# Patient Record
Sex: Female | Born: 2000 | Race: White | Hispanic: No | Marital: Single | Smoking: Current some day smoker
Health system: Southern US, Community
[De-identification: ages and names within clinical notes are randomized; demographics above are authoritative.]

## PROBLEM LIST (undated history)

## (undated) DIAGNOSIS — F419 Anxiety disorder, unspecified: Secondary | ICD-10-CM

## (undated) HISTORY — DX: Anxiety disorder, unspecified: F41.9

## (undated) HISTORY — PX: TONSILLECTOMY: SUR1361

---
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
## 2018-06-27 ENCOUNTER — Ambulatory Visit (INDEPENDENT_AMBULATORY_CARE_PROVIDER_SITE_OTHER): Admitting: Family Medicine

## 2018-06-27 ENCOUNTER — Encounter: Payer: Self-pay | Admitting: Family Medicine

## 2018-06-27 VITALS — BP 110/70 | HR 74 | Temp 98.6°F | Resp 15 | Wt 148.1 lb

## 2018-06-27 DIAGNOSIS — N3001 Acute cystitis with hematuria: Secondary | ICD-10-CM

## 2018-06-27 LAB — URINALYSIS, ROUTINE W REFLEX MICROSCOPIC
Bilirubin Urine: NEGATIVE
Glucose, UA: NEGATIVE
KETONES UR: NEGATIVE
LEUKOCYTES UA: NEGATIVE
Nitrite: NEGATIVE
PH: 5.5 (ref 5.0–8.0)
Protein, ur: NEGATIVE
Specific Gravity, Urine: 1.025 (ref 1.001–1.03)
WBC, UA: NONE SEEN /HPF (ref 0–5)

## 2018-06-27 LAB — MICROSCOPIC MESSAGE

## 2018-06-27 MED ORDER — CEPHALEXIN 500 MG PO CAPS: 500.0000 mg | ORAL_CAPSULE | Freq: Four times a day (QID) | ORAL | 0 refills | Status: AC

## 2018-06-27 NOTE — Progress Notes (Signed)
Patient ID: Julie Morse, female    DOB: 08/19/2000, 18 y.o.   MRN: [id number]  PCP: Dorena Bodoixon, Mary B, PA-C  Chief Complaint  Patient presents with  . Urinary Tract Infection    Patient in today with c/o burning with urination, and lower abdominal discomfort, urgency. Onset a few days     Subjective:   Julie Morse is a [age] y.o. female, presents to clinic with CC of Few sx for 3-4 days, mild burning when she peed, yesterday it was suddenly worse, with urgency, decreased urine amount, had hurting to bladder, + frequency + urgency A little better today, she drank more waters and cranberry juice Has IUD, sexually active with one female partner. No vaginal sx, no back pain, other abdominal pain.  She had some nausea yesterday but not today, and no V, D, flank pain, decreased appetite, fever.   No hx of UTI Denies any STD hx    Patient Active Problem List   Diagnosis Date Noted  . Varicose veins of left lower extremity with complications 12/11/2016  . Depression 12/26/2015  . Anxiety      Prior to Admission medications   Not on File     No Known Allergies   Family History  Problem Relation Age of Onset  . Depression Mother   . Mental illness Mother   . Hypertension Maternal Grandmother   . Hyperlipidemia Maternal Grandmother   . Heart disease Maternal Grandmother   . Mental illness Paternal Grandfather   . Heart disease Maternal Grandfather      Social History   Socioeconomic History  . Marital status: Single    Spouse name: Not on file  . Number of children: Not on file  . Years of education: Not on file  . Highest education level: Not on file  Occupational History  . Not on file  Social Needs  . Financial resource strain: Not on file  . Food insecurity:    Worry: Not on file    Inability: Not on file  . Transportation needs:    Medical: Not on file    Non-medical: Not on file  Tobacco Use  . Smoking status: Current Some Day Smoker    Packs/day: 0.25   . Smokeless tobacco: Never Used  . Tobacco comment: smokes 1 cigarette per day for 2 months  Substance and Sexual Activity  . Alcohol use: No  . Drug use: No  . Sexual activity: Never  Lifestyle  . Physical activity:    Days per week: Not on file    Minutes per session: Not on file  . Stress: Not on file  Relationships  . Social connections:    Talks on phone: Not on file    Gets together: Not on file    Attends religious service: Not on file    Active member of club or organization: Not on file    Attends meetings of clubs or organizations: Not on file    Relationship status: Not on file  . Intimate partner violence:    Fear of current or ex partner: Not on file    Emotionally abused: Not on file    Physically abused: Not on file    Forced sexual activity: Not on file  Other Topics Concern  . Not on file  Social History Narrative   Entered 04/2014:      At Home:   Mom, Step Dad, 297 y/o sister.      Mom has 4  children.   Step Dad has 2 children   Patient's biologic father and step mom each have multiple children.    Pt sees all of these "siblings" often.      Beginning Feb 2015, pt has been "home Schooling"--takes classes on-line   Will graduate at early age.   Several of her siblings have done this also.    Mom says they are all very motivated to learn but just haven't liked highschools.      Pt does no sports or activities.    Mom joined gym and signed pt up --trying to get pt to go with her but so far, she doesn't want to go.    Pt does walk outside about 30 minutes a day.      Review of Systems  Constitutional: Negative.   HENT: Negative.   Eyes: Negative.   Respiratory: Negative.   Cardiovascular: Negative.   Gastrointestinal: Negative.   Endocrine: Negative.   Genitourinary: Negative.   Musculoskeletal: Negative.   Skin: Negative.   Allergic/Immunologic: Negative.   Neurological: Negative.   Hematological: Negative.   Psychiatric/Behavioral:  Negative.   All other systems reviewed and are negative.      Objective:    Vitals:   06/27/18 1108  BP: 110/70  Pulse: 74  Resp: 15  Temp: 98.6 F (37 C)  TempSrc: Oral  SpO2: 98%  Weight: 148 lb 2 oz (67.2 kg)      Physical Exam Vitals signs and nursing note reviewed.  Constitutional:      General: She is not in acute distress.    Appearance: Normal appearance. She is well-developed. She is not ill-appearing, toxic-appearing or diaphoretic.  HENT:     Head: Normocephalic and atraumatic.     Right Ear: External ear normal.     Left Ear: External ear normal.     Nose: Nose normal.     Mouth/Throat:     Mouth: Mucous membranes are moist.     Pharynx: Oropharynx is clear. Uvula midline.  Eyes:     General: Lids are normal.     Conjunctiva/sclera: Conjunctivae normal.     Pupils: Pupils are equal, round, and reactive to light.  Neck:     Musculoskeletal: Normal range of motion and neck supple.     Trachea: Phonation normal. No tracheal deviation.  Cardiovascular:     Rate and Rhythm: Normal rate and regular rhythm.     Pulses: Normal pulses.          Radial pulses are 2+ on the right side and 2+ on the left side.       Posterior tibial pulses are 2+ on the right side and 2+ on the left side.     Heart sounds: Normal heart sounds. No murmur. No friction rub. No gallop.   Pulmonary:     Effort: Pulmonary effort is normal. No respiratory distress.     Breath sounds: Normal breath sounds. No stridor. No wheezing, rhonchi or rales.  Chest:     Chest wall: No tenderness.  Abdominal:     General: Bowel sounds are normal. There is no distension.     Palpations: Abdomen is soft.     Tenderness: There is no abdominal tenderness. There is no right CVA tenderness, left CVA tenderness, guarding or rebound.  Musculoskeletal: Normal range of motion.        General: No deformity.  Lymphadenopathy:     Cervical: No cervical adenopathy.  Skin:    General: Skin  is warm and dry.      Capillary Refill: Capillary refill takes less than 2 seconds.     Coloration: Skin is not pale.     Findings: No rash.  Neurological:     Mental Status: She is alert and oriented to person, place, and time.     Motor: No abnormal muscle tone.     Gait: Gait normal.  Psychiatric:        Speech: Speech normal.        Behavior: Behavior normal.           Assessment & Plan:   Healthy young female with 3 days of urinary sx, most severe yesterday, improving a little today, no tenderness on exam, UA + for blood and was cloudy.  Microscopy pending.  Will tx for uncomplicated uti with 3 d keflex.  F/up if not improving.  She is low risk for any other cause of dysuria symptoms has no history of UTIs or STDs, no vaginal symptoms she is very well-appearing today.    ICD-10-CM   1. Acute cystitis with hematuria N30.01 Urinalysis, Routine w reflex microscopic       Danelle Berry, PA-C 06/27/18 11:23 AM

## 2019-01-28 ENCOUNTER — Other Ambulatory Visit: Payer: Self-pay

## 2019-01-28 ENCOUNTER — Encounter: Payer: Self-pay | Admitting: Family Medicine

## 2019-01-28 ENCOUNTER — Ambulatory Visit (INDEPENDENT_AMBULATORY_CARE_PROVIDER_SITE_OTHER): Admitting: Family Medicine

## 2019-01-28 VITALS — BP 118/70 | HR 66 | Temp 98.1°F | Resp 14 | Ht 65.5 in | Wt 140.0 lb

## 2019-01-28 DIAGNOSIS — N926 Irregular menstruation, unspecified: Secondary | ICD-10-CM

## 2019-01-28 DIAGNOSIS — Z113 Encounter for screening for infections with a predominantly sexual mode of transmission: Secondary | ICD-10-CM | POA: Diagnosis not present

## 2019-01-28 DIAGNOSIS — Z0001 Encounter for general adult medical examination with abnormal findings: Secondary | ICD-10-CM | POA: Diagnosis not present

## 2019-01-28 DIAGNOSIS — Z0000 Encounter for general adult medical examination without abnormal findings: Secondary | ICD-10-CM

## 2019-01-28 LAB — WET PREP FOR TRICH, YEAST, CLUE

## 2019-01-28 LAB — PREGNANCY, URINE: Preg Test, Ur: NEGATIVE

## 2019-01-28 NOTE — Progress Notes (Signed)
   Subjective:    Patient ID: Julie Morse, female    DOB: [birth year], 18 y.o.   MRN: [id number]  Patient presents for Annual Exam (is fasting)   Pt here for CPE    Has [place] IUD, was having regulary periods until recently, now it is 4 weeks    Has some vaginal discharge   1 partner    No exercise    tOBACCO 1-2Cig day    No current dentist   No eye doctor     Wants to hold on vaccines today, due for meningitis vaccines      No current meds    Review Of Systems:  GEN- denies fatigue, fever, weight loss,weakness, recent illness HEENT- denies eye drainage, change in vision, nasal discharge, CVS- denies chest pain, palpitations RESP- denies SOB, cough, wheeze ABD- denies N/V, change in stools, abd pain GU- denies dysuria, hematuria, dribbling, incontinence MSK- denies joint pain, muscle aches, injury Neuro- denies headache, dizziness, syncope, seizure activity       Objective:    BP 118/70   Pulse 66   Temp 98.1 F (36.7 C) (Oral)   Resp 14   Ht 5' 5.5" (1.664 m)   Wt 140 lb (63.5 kg)   SpO2 98%   BMI 22.94 kg/m  GEN- NAD, alert and oriented x3 HEENT- PERRL, EOMI, non injected sclera, pink conjunctiva, MMM, oropharynx clear Neck- Supple, no thyromegaly CVS- RRR, no murmur RESP-CTAB ABD-NABS,soft,NT,ND GU- normal external genitalia, vaginal mucosa pink and moist, cervix visualized no growth, no blood form os, +discharge, IUD strings visualized  no CMT,  uterus normal size EXT- No edema Pulses- Radial, DP- 2+   U preg negative   Audit C/ Fall/Depression screen negative     Assessment & Plan:      Problem List Items Addressed This Visit    None    Visit Diagnoses    Routine general medical examination at a health care facility    -  Primary   CPE done, pt declined HIV /blood draw, will return for meningitis vaccines, STD screen done   Irregular menses       U preg neg, expect this is due to her IUD, often many women do not have menses at 1 year  mark, advised however if she gets abd pain, cramping recheck home pregnancy test and call her GYN  Also discussed tobacco cessation    Relevant Orders   Pregnancy, urine (Completed)   Screen for STD (sexually transmitted disease)       Relevant Orders   WET PREP FOR [place], YEAST, CLUE (Completed)   C. trachomatis/N. gonorrhoeae RNA      Note: This dictation was prepared with Dragon dictation along with smaller Company secretary. Any transcriptional errors that result from this process are unintentional.

## 2019-01-28 NOTE — Patient Instructions (Signed)
We will call with results Schedule nurse visit for your shots F/U 1 year for physical

## 2019-01-29 LAB — C. TRACHOMATIS/N. GONORRHOEAE RNA
C. trachomatis RNA, TMA: NOT DETECTED
N. gonorrhoeae RNA, TMA: NOT DETECTED

## 2019-01-30 ENCOUNTER — Other Ambulatory Visit: Payer: Self-pay | Admitting: *Deleted

## 2019-01-30 MED ORDER — METRONIDAZOLE 500 MG PO TABS: 500.0000 mg | ORAL_TABLET | Freq: Two times a day (BID) | ORAL | 0 refills | Status: DC

## 2019-03-04 ENCOUNTER — Encounter: Payer: Self-pay | Admitting: Family Medicine

## 2019-03-04 ENCOUNTER — Ambulatory Visit (INDEPENDENT_AMBULATORY_CARE_PROVIDER_SITE_OTHER): Admitting: Family Medicine

## 2019-03-04 ENCOUNTER — Other Ambulatory Visit: Payer: Self-pay

## 2019-03-04 VITALS — BP 118/64 | HR 68 | Temp 98.4°F | Resp 14 | Ht 65.5 in | Wt 143.0 lb

## 2019-03-04 DIAGNOSIS — B9689 Other specified bacterial agents as the cause of diseases classified elsewhere: Secondary | ICD-10-CM

## 2019-03-04 DIAGNOSIS — N760 Acute vaginitis: Secondary | ICD-10-CM | POA: Diagnosis not present

## 2019-03-04 DIAGNOSIS — R319 Hematuria, unspecified: Secondary | ICD-10-CM | POA: Diagnosis not present

## 2019-03-04 DIAGNOSIS — N390 Urinary tract infection, site not specified: Secondary | ICD-10-CM

## 2019-03-04 LAB — URINALYSIS, ROUTINE W REFLEX MICROSCOPIC
Bilirubin Urine: NEGATIVE
Glucose, UA: NEGATIVE
Hyaline Cast: NONE SEEN /LPF
Ketones, ur: NEGATIVE
Nitrite: NEGATIVE
Protein, ur: NEGATIVE
Specific Gravity, Urine: 1.001 (ref 1.001–1.03)
pH: 6 (ref 5.0–8.0)

## 2019-03-04 LAB — MICROSCOPIC MESSAGE

## 2019-03-04 MED ORDER — METRONIDAZOLE 500 MG PO TABS: 500.0000 mg | ORAL_TABLET | Freq: Two times a day (BID) | ORAL | 0 refills | Status: DC

## 2019-03-04 MED ORDER — NITROFURANTOIN MONOHYD MACRO 100 MG PO CAPS: 100.0000 mg | ORAL_CAPSULE | Freq: Two times a day (BID) | ORAL | 0 refills | Status: DC

## 2019-03-04 NOTE — Patient Instructions (Signed)
Take both antibiotics Call us if you dont completely clear up  F/U as needed

## 2019-03-04 NOTE — Progress Notes (Signed)
   Subjective:    Patient ID: Julie Morse, female    DOB: 2001/01/30, 18 y.o.   MRN: [id number]  Patient presents for Dysuria (x1 day- has taken AZO today- urning and frequency)  Patient here with dysuria that started yesterday but worsened this morning.  She denies any gross blood in the urine.  She has had frequency since yesterday along with some suprapubic discomfort.  She denies any vaginal bleeding but states her cycle ended 3 days ago.  She does have [place] IUD.  She was treated for bacterial vaginosis about a month ago states that most of the symptoms improved but she still had a fishy odor with mild discharge   Review Of Systems:  GEN- denies fatigue, fever, weight loss,weakness, recent illness HEENT- denies eye drainage, change in vision, nasal discharge, CVS- denies chest pain, palpitations RESP- denies SOB, cough, wheeze ABD- denies N/V, change in stools, abd pain GU- denies dysuria, hematuria, dribbling, incontinence MSK- denies joint pain, muscle aches, injury Neuro- denies headache, dizziness, syncope, seizure activity       Objective:    BP 118/64   Pulse 68   Temp 98.4 F (36.9 C) (Oral)   Resp 14   Ht 5' 5.5" (1.664 m)   Wt 143 lb (64.9 kg)   SpO2 98%   BMI 23.43 kg/m  GEN- NAD, alert and oriented x3 HEENT- MMM, oropharynx clear , non icteric  CVS- RRR, no murmur RESP-CTAB ABD-NABS,soft, to palpation suprapubic region no CVA tenderness nondistended EXT- No edema Pulses- Radial  2+        Assessment & Plan:      Problem List Items Addressed This Visit    None    Visit Diagnoses    Urinary tract infection with hematuria, site unspecified    -  Primary   Treat with macrobid, continue AZo for irritation, will also give 2nd course of flagyl for the BV since it didnt completely clear, cultures were not done again   Relevant Medications   nitrofurantoin, macrocrystal-monohydrate, (MACROBID) 100 MG capsule   metroNIDAZOLE (FLAGYL) 500 MG tablet   Other Relevant Orders   Urinalysis, Routine w reflex microscopic (Completed)   BV (bacterial vaginosis)       Relevant Medications   metroNIDAZOLE (FLAGYL) 500 MG tablet      Note: This dictation was prepared with Dragon dictation along with smaller phrase technology. Any transcriptional errors that result from this process are unintentional.

## 2021-05-30 ENCOUNTER — Ambulatory Visit: Payer: Self-pay

## 2021-05-30 ENCOUNTER — Ambulatory Visit
Admission: EM | Admit: 2021-05-30 | Discharge: 2021-05-30 | Disposition: A | Discharge status: Home / Self Care | Attending: Student | Admitting: Student

## 2021-05-30 ENCOUNTER — Other Ambulatory Visit: Payer: Self-pay

## 2021-05-30 ENCOUNTER — Encounter: Payer: Self-pay | Admitting: Emergency Medicine

## 2021-05-30 DIAGNOSIS — Z20828 Contact with and (suspected) exposure to other viral communicable diseases: Secondary | ICD-10-CM

## 2021-05-30 DIAGNOSIS — R0989 Other specified symptoms and signs involving the circulatory and respiratory systems: Secondary | ICD-10-CM

## 2021-05-30 MED ORDER — LIDOCAINE VISCOUS HCL 2 % MT SOLN: 15.0000 mL | OROMUCOSAL | 0 refills | Status: AC | PRN

## 2021-05-30 MED ORDER — LIDOCAINE VISCOUS HCL 2 % MT SOLN: 15.0000 mL | OROMUCOSAL | 0 refills | Status: DC | PRN

## 2021-05-30 NOTE — ED Triage Notes (Signed)
Fever x 2 days.  Cough, sore throat, headache.

## 2021-05-30 NOTE — Discharge Instructions (Addendum)
-  For sore throat, use lidocaine mouthwash up to every 4 hours. Make sure not to eat for at least 1 hour after using this, as your mouth will be very numb and you could bite yourself. -You can take Tylenol up to 1000 mg 3 times daily, and ibuprofen up to 600 mg 3 times daily with food.  You can take these together, or alternate every 3-4 hours. -With a virus, you're typically contagious for 5-7 days, or as long as you're having fevers.   Pt declines printed AVS

## 2021-05-30 NOTE — ED Provider Notes (Signed)
[facility]    CSN: [id number] Arrival date & time: 05/30/21  1246      History   Chief Complaint No chief complaint on file.   HPI Julie Morse is a 20 y.o. female presenting with viral symptoms for 3 days, resolving on their own.  Medical history tonsillectomy.  fevers and chills she has not monitored her temperature at home.  Nonproductive cough.  Sore throat.  Headache.  Symptoms have largely resolved except for the sore throat.  Over-the-counter medications providing little relief.  Exposure to flu at work.  Denies shortness of breath, chest pain, dizziness, nausea/vomiting/diarrhea.  HPI  Past Medical History:  Diagnosis Date   Anxiety     Patient Active Problem List   Diagnosis Date Noted   Varicose veins of left lower extremity with complications 12/11/2016   Depression 12/26/2015   Anxiety     Past Surgical History:  Procedure Laterality Date   ENDOVENOUS ABLATION SAPHENOUS VEIN W/ LASER Left 07/08/2017   endovenous laser ablation L [facility] and stab phlebectomy Left leg by Josephina Gip MD    TONSILLECTOMY      OB History   No obstetric history on file.      Home Medications    Prior to Admission medications   Medication Sig Start Date End Date Taking? Authorizing Provider  lidocaine (XYLOCAINE) 2 % solution Use as directed 15 mLs in the mouth or throat every 4 (four) hours as needed for mouth pain. 05/30/21   Rhys Martini, PA-C    Family History Family History  Problem Relation Age of Onset   Depression Mother    Mental illness Mother    Hypertension Maternal Grandmother    Hyperlipidemia Maternal Grandmother    Heart disease Maternal Grandmother    Mental illness Paternal Grandfather    Heart disease Maternal Grandfather     Social History Social History   Tobacco Use   Smoking status: Some Days    Packs/day: 0.25    Types: Cigarettes   Smokeless tobacco: Never   Tobacco comments:    smokes 1 cigarette per day for 2  months  Substance Use Topics   Alcohol use: No   Drug use: No     Allergies   Patient has no known allergies.   Review of Systems Review of Systems  Constitutional:  Positive for chills and fever. Negative for appetite change.  HENT:  Positive for congestion and sore throat. Negative for ear pain, rhinorrhea, sinus pressure and sinus pain.   Eyes:  Negative for redness and visual disturbance.  Respiratory:  Positive for cough. Negative for chest tightness, shortness of breath and wheezing.   Cardiovascular:  Negative for chest pain and palpitations.  Gastrointestinal:  Negative for abdominal pain, constipation, diarrhea, nausea and vomiting.  Genitourinary:  Negative for dysuria, frequency and urgency.  Musculoskeletal:  Negative for myalgias.  Neurological:  Negative for dizziness, weakness and headaches.  Psychiatric/Behavioral:  Negative for confusion.   All other systems reviewed and are negative.   Physical Exam Triage Vital Signs ED Triage Vitals  Enc Vitals Group     BP 05/30/21 1327 118/82     Pulse Rate 05/30/21 1327 67     Resp 05/30/21 1327 18     Temp 05/30/21 1327 99.4 F (37.4 C)     Temp Source 05/30/21 1327 Oral     SpO2 05/30/21 1327 98 %     Weight --      Height --  Head Circumference --      Peak Flow --      Pain Score 05/30/21 1328 4     Pain Loc --      Pain Edu? --      Excl. in [place]? --    No data found.  Updated Vital Signs BP 118/82 (BP Location: Right Arm)    Pulse 67    Temp 99.4 F (37.4 C) (Oral)    Resp 18    LMP 05/11/2021 (Approximate)    SpO2 98%   Visual Acuity Right Eye Distance:   Left Eye Distance:   Bilateral Distance:    Right Eye Near:   Left Eye Near:    Bilateral Near:     Physical Exam Vitals reviewed.  Constitutional:      General: She is not in acute distress.    Appearance: Normal appearance. She is not ill-appearing.  HENT:     Head: Normocephalic and atraumatic.     Right Ear: Tympanic membrane,  ear canal and external ear normal. No tenderness. No middle ear effusion. There is no impacted cerumen. Tympanic membrane is not perforated, erythematous, retracted or bulging.     Left Ear: Tympanic membrane, ear canal and external ear normal. No tenderness.  No middle ear effusion. There is no impacted cerumen. Tympanic membrane is not perforated, erythematous, retracted or bulging.     Nose: Nose normal. No congestion.     Mouth/Throat:     Mouth: Mucous membranes are moist.     Pharynx: Uvula midline. Posterior oropharyngeal erythema present. No oropharyngeal exudate.     Tonsils: No tonsillar exudate. 0 on the right. 0 on the left.     Comments: Smooth erythema posterior pharynx.  Tonsils are absent. On exam, uvula is midline, she is tolerating her secretions without difficulty, there is no trismus, no drooling, she has normal phonation  Eyes:     Extraocular Movements: Extraocular movements intact.     Pupils: Pupils are equal, round, and reactive to light.  Cardiovascular:     Rate and Rhythm: Normal rate and regular rhythm.     Heart sounds: Normal heart sounds.  Pulmonary:     Effort: Pulmonary effort is normal.     Breath sounds: Normal breath sounds. No decreased breath sounds, wheezing, rhonchi or rales.  Abdominal:     Palpations: Abdomen is soft.     Tenderness: There is no abdominal tenderness. There is no guarding or rebound.  Lymphadenopathy:     Cervical: No cervical adenopathy.     Right cervical: No superficial cervical adenopathy.    Left cervical: No superficial cervical adenopathy.  Neurological:     General: No focal deficit present.     Mental Status: She is alert and oriented to person, place, and time.  Psychiatric:        Mood and Affect: Mood normal.        Behavior: Behavior normal.        Thought Content: Thought content normal.        Judgment: Judgment normal.     UC Treatments / Results  Labs (all labs ordered are listed, but only abnormal  results are displayed) Labs Reviewed  COVID-19, FLU A+B NAA    EKG   Radiology No results found.  Procedures Procedures (including critical care time)  Medications Ordered in UC Medications - No data to display  Initial Impression / Assessment and Plan / UC Course  I have reviewed the triage vital  signs and the nursing notes.  Pertinent labs & imaging results that were available during my care of the patient were reviewed by me and considered in my medical decision making (see chart for details).     This patient is a very pleasant 20 y.o. year old female presenting with viral pharyngitis following exposure to influenza. Today this pt is afebrile nontachycardic nontachypneic, oxygenating well on room air, no wheezes rhonchi or rales.   Centor score 0, rapid strep deferred. History tonsillectomy. Covid and influenza PCR sent.  Viscous lidocaine sent.   ED return precautions discussed. Patient verbalizes understanding and agreement.      Final Clinical Impressions(s) / UC Diagnoses   Final diagnoses:  Exposure to the flu  Suspected novel influenza A virus infection     Discharge Instructions      -For sore throat, use lidocaine mouthwash up to every 4 hours. Make sure not to eat for at least 1 hour after using this, as your mouth will be very numb and you could bite yourself. -You can take Tylenol up to 1000 mg 3 times daily, and ibuprofen up to 600 mg 3 times daily with food.  You can take these together, or alternate every 3-4 hours. -With a virus, you're typically contagious for 5-7 days, or as long as you're having fevers.   Pt declines printed AVS   ED Prescriptions     Medication Sig Dispense Auth. Provider   lidocaine (XYLOCAINE) 2 % solution  (Status: Discontinued) Use as directed 15 mLs in the mouth or throat as needed for mouth pain. 100 mL Marin Roberts E, PA-C   lidocaine (XYLOCAINE) 2 % solution Use as directed 15 mLs in the mouth or throat every 4  (four) hours as needed for mouth pain. 100 mL Hazel Sams, PA-C      PDMP not reviewed this encounter.   Hazel Sams, PA-C 05/30/21 1346

## 2021-05-31 LAB — COVID-19, FLU A+B NAA
Influenza A, NAA: NOT DETECTED
Influenza B, NAA: NOT DETECTED
SARS-CoV-2, NAA: DETECTED — AB
